# Patient Record
Sex: Male | Born: 2000 | Race: Black or African American | Hispanic: No | Marital: Single | Smoking: Never smoker
Health system: Southern US, Community
[De-identification: ages and names within clinical notes are randomized; demographics above are authoritative.]

---
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
## 2019-11-28 ENCOUNTER — Ambulatory Visit
Admission: EM | Admit: 2019-11-28 | Discharge: 2019-11-28 | Disposition: A | Discharge status: Home / Self Care | Payer: Medicaid Other | Attending: Physician Assistant | Admitting: Physician Assistant

## 2019-11-28 DIAGNOSIS — R0981 Nasal congestion: Secondary | ICD-10-CM

## 2019-11-28 DIAGNOSIS — H6983 Other specified disorders of Eustachian tube, bilateral: Secondary | ICD-10-CM

## 2019-11-28 DIAGNOSIS — J3489 Other specified disorders of nose and nasal sinuses: Secondary | ICD-10-CM

## 2019-11-28 MED ORDER — FLUTICASONE PROPIONATE 50 MCG/ACT NA SUSP: 2.0000 | Freq: Every day | NASAL | 0 refills | Status: DC

## 2019-11-28 MED ORDER — AZELASTINE HCL 0.1 % NA SOLN: 2.0000 | Freq: Two times a day (BID) | NASAL | 0 refills | Status: DC

## 2019-11-28 MED ORDER — CETIRIZINE HCL 10 MG PO TABS: 10.0000 mg | ORAL_TABLET | Freq: Every day | ORAL | 0 refills | Status: DC

## 2019-11-28 NOTE — Discharge Instructions (Signed)
COVID PCR testing ordered. I would like you to quarantine until testing results. Start zyrtec as directed. Flonase and azelastine as directed. Tylenol/motrin for pain and fever. Keep hydrated, urine should be clear to pale yellow in color. If experiencing shortness of breath, trouble breathing, go to the emergency department for further evaluation needed.

## 2019-11-28 NOTE — ED Provider Notes (Signed)
[facility]    CSN: [id number] Arrival date & time: 11/28/19  1300      History   Chief Complaint Chief Complaint  Patient presents with  . Nasal Congestion    HPI Robert Terry is a 19 y.o. male.   19 year old male comes in for 5 day of URI symptoms. Nasal congestion, sinus pressure. Minimal cough. Denies fever, chills, body aches. Denies abdominal pain, nausea, vomiting, diarrhea. Denies shortness of breath, loss of taste/smell. otc cold medicine, antihistamine     History reviewed. No pertinent past medical history.  There are no problems to display for this patient.   History reviewed. No pertinent surgical history.     Home Medications    Prior to Admission medications   Medication Sig Start Date End Date Taking? Authorizing Provider  azelastine (ASTELIN) 0.1 % nasal spray Place 2 sprays into both nostrils 2 (two) times daily. 11/28/19   Tasia Catchings, Kamilia Carollo V, PA-C  cetirizine (ZYRTEC) 10 MG tablet Take 1 tablet (10 mg total) by mouth daily. 11/28/19   Tasia Catchings, Gregoire Bennis V, PA-C  fluticasone (FLONASE) 50 MCG/ACT nasal spray Place 2 sprays into both nostrils daily. 11/28/19   Ok Edwards, PA-C    Family History History reviewed. No pertinent family history.  Social History Social History   Tobacco Use  . Smoking status: Never Smoker  . Smokeless tobacco: Never Used  Substance Use Topics  . Alcohol use: Not Currently  . Drug use: Not Currently     Allergies   Patient has no known allergies.   Review of Systems Review of Systems  Reason unable to perform ROS: See HPI as above.     Physical Exam Triage Vital Signs ED Triage Vitals  Enc Vitals Group     BP 11/28/19 1311 129/82     Pulse Rate 11/28/19 1311 92     Resp 11/28/19 1311 18     Temp 11/28/19 1311 98.3 F (36.8 C)     Temp Source 11/28/19 1311 Oral     SpO2 11/28/19 1311 98 %     Weight --      Height --      Head Circumference --      Peak Flow --      Pain Score 11/28/19 1319 0   Pain Loc --      Pain Edu? --      Excl. in [place]? --    No data found.  Updated Vital Signs BP 129/82 (BP Location: Left Arm)   Pulse 92   Temp 98.3 F (36.8 C) (Oral)   Resp 18   SpO2 98%   Physical Exam Constitutional:      General: He is not in acute distress.    Appearance: Normal appearance. He is not ill-appearing, toxic-appearing or diaphoretic.  HENT:     Head: Normocephalic and atraumatic.     Right Ear: Ear canal and external ear normal. No middle ear effusion. Tympanic membrane is erythematous. Tympanic membrane is not bulging.     Left Ear: Ear canal and external ear normal. A middle ear effusion is present. Tympanic membrane is erythematous. Tympanic membrane is not bulging.     Nose:     Right Sinus: Maxillary sinus tenderness and frontal sinus tenderness present.     Left Sinus: Maxillary sinus tenderness and frontal sinus tenderness present.     Mouth/Throat:     Mouth: Mucous membranes are moist.     Pharynx: Oropharynx is  clear. Uvula midline.  Cardiovascular:     Rate and Rhythm: Normal rate and regular rhythm.     Heart sounds: Normal heart sounds. No murmur. No friction rub. No gallop.   Pulmonary:     Effort: Pulmonary effort is normal. No accessory muscle usage, prolonged expiration, respiratory distress or retractions.     Comments: Lungs clear to auscultation without adventitious lung sounds. Musculoskeletal:     Cervical back: Normal range of motion and neck supple.  Neurological:     General: No focal deficit present.     Mental Status: He is alert and oriented to person, place, and time.      UC Treatments / Results  Labs (all labs ordered are listed, but only abnormal results are displayed) Labs Reviewed  NOVEL CORONAVIRUS, NAA    EKG   Radiology No results found.  Procedures Procedures (including critical care time)  Medications Ordered in UC Medications - No data to display  Initial Impression / Assessment and Plan / UC Course   I have reviewed the triage vital signs and the nursing notes.  Pertinent labs & imaging results that were available during my care of the patient were reviewed by me and considered in my medical decision making (see chart for details).    Bilateral erythematous TM without ear pain, likely due to to eustachian tube dysfunction, will defer antibiotics at this time.  Covid testing ordered, patient to quarantine until testing results return.  Will provide symptomatic management.  Push fluids.  Return precautions given.  Patient expresses understanding and agrees to plan.  Final Clinical Impressions(s) / UC Diagnoses   Final diagnoses:  Nasal congestion  Sinus pressure  Dysfunction of both eustachian tubes   ED Prescriptions    Medication Sig Dispense Auth. Provider   cetirizine (ZYRTEC) 10 MG tablet Take 1 tablet (10 mg total) by mouth daily. 30 tablet Reganne Messerschmidt V, PA-C   fluticasone (FLONASE) 50 MCG/ACT nasal spray Place 2 sprays into both nostrils daily. 1 g Emalie Mcwethy V, PA-C   azelastine (ASTELIN) 0.1 % nasal spray Place 2 sprays into both nostrils 2 (two) times daily. 30 mL Belinda Fisher, PA-C     PDMP not reviewed this encounter.   Belinda Fisher, PA-C 11/28/19 1348

## 2019-11-28 NOTE — ED Triage Notes (Signed)
Pt c/o nasal congestion and sinus pressure since Saturday.

## 2019-11-29 LAB — NOVEL CORONAVIRUS, NAA: SARS-CoV-2, NAA: DETECTED — AB

## 2019-11-29 LAB — SARS-COV-2, NAA 2 DAY TAT

## 2020-04-30 ENCOUNTER — Ambulatory Visit
Admission: EM | Admit: 2020-04-30 | Discharge: 2020-04-30 | Disposition: A | Discharge status: Home / Self Care | Payer: Medicaid Other | Attending: Emergency Medicine | Admitting: Emergency Medicine

## 2020-04-30 DIAGNOSIS — R519 Headache, unspecified: Secondary | ICD-10-CM

## 2020-04-30 DIAGNOSIS — Z1152 Encounter for screening for COVID-19: Secondary | ICD-10-CM | POA: Diagnosis not present

## 2020-04-30 MED ORDER — FLUTICASONE PROPIONATE 50 MCG/ACT NA SUSP: 1.0000 | Freq: Every day | NASAL | 0 refills | Status: DC

## 2020-04-30 MED ORDER — NAPROXEN 500 MG PO TABS: 500.0000 mg | ORAL_TABLET | Freq: Two times a day (BID) | ORAL | 0 refills | Status: DC

## 2020-04-30 NOTE — ED Triage Notes (Signed)
Pt present a headache, symptoms started two days ago. Pt has tried otc medication with no relief.

## 2020-04-30 NOTE — Discharge Instructions (Signed)
Naprosyn twice daily for headache Flonase nasal spray to help with nasal congestion/sinus pressure Rest and fluids Follow-up if not improving or worsening

## 2020-04-30 NOTE — ED Provider Notes (Signed)
[facility]    CSN: [id number] Arrival date & time: 04/30/20  7616      History   Chief Complaint Chief Complaint  Patient presents with  . Headache    HPI Robert Terry is a 19 y.o. male presenting today for evaluation of headache. Began yesterday. Took 800 mg ibuprofen. Feels similar to headache with prior covid infection. Had COVID end of April. Mild congestion. Denies sore throat cough. Headache is frontal, no vision changes. Denies GI changes. Often headache with not eating.   HPI  History reviewed. No pertinent past medical history.  There are no problems to display for this patient.   History reviewed. No pertinent surgical history.     Home Medications    Prior to Admission medications   Medication Sig Start Date End Date Taking? Authorizing Provider  azelastine (ASTELIN) 0.1 % nasal spray Place 2 sprays into both nostrils 2 (two) times daily. 11/28/19   Cathie Hoops, Amy V, PA-C  cetirizine (ZYRTEC) 10 MG tablet Take 1 tablet (10 mg total) by mouth daily. 11/28/19   Cathie Hoops, Amy V, PA-C  fluticasone (FLONASE) 50 MCG/ACT nasal spray Place 1-2 sprays into both nostrils daily. 04/30/20   Dawn Kiper C, PA-C  naproxen (NAPROSYN) 500 MG tablet Take 1 tablet (500 mg total) by mouth 2 (two) times daily. 04/30/20   Latori Beggs, Junius Creamer, PA-C    Family History History reviewed. No pertinent family history.  Social History Social History   Tobacco Use  . Smoking status: Never Smoker  . Smokeless tobacco: Never Used  Substance Use Topics  . Alcohol use: Not Currently  . Drug use: Not Currently     Allergies   Patient has no known allergies.   Review of Systems Review of Systems  Constitutional: Negative for fatigue and fever.  HENT: Negative for congestion, sinus pressure and sore throat.   Eyes: Negative for photophobia, pain and visual disturbance.  Respiratory: Negative for cough and shortness of breath.   Cardiovascular: Negative for chest pain.    Gastrointestinal: Negative for abdominal pain, nausea and vomiting.  Genitourinary: Negative for decreased urine volume and hematuria.  Musculoskeletal: Negative for myalgias, neck pain and neck stiffness.  Neurological: Positive for headaches. Negative for dizziness, syncope, facial asymmetry, speech difficulty, weakness, light-headedness and numbness.     Physical Exam Triage Vital Signs ED Triage Vitals  Enc Vitals Group     BP 04/30/20 0841 127/85     Pulse Rate 04/30/20 0841 72     Resp 04/30/20 0841 16     Temp 04/30/20 0841 98.2 F (36.8 C)     Temp Source 04/30/20 0841 Oral     SpO2 04/30/20 0841 97 %     Weight --      Height --      Head Circumference --      Peak Flow --      Pain Score 04/30/20 0842 6     Pain Loc --      Pain Edu? --      Excl. in [place]? --    No data found.  Updated Vital Signs BP 127/85 (BP Location: Left Arm)   Pulse 72   Temp 98.2 F (36.8 C) (Oral)   Resp 16   SpO2 97%   Visual Acuity Right Eye Distance:   Left Eye Distance:   Bilateral Distance:    Right Eye Near:   Left Eye Near:    Bilateral Near:     Physical  Exam Vitals and nursing note reviewed.  Constitutional:      Appearance: He is well-developed.     Comments: No acute distress  HENT:     Head: Normocephalic and atraumatic.     Nose: Nose normal.     Mouth/Throat:     Comments: Oral mucosa pink and moist, no tonsillar enlargement or exudate. Posterior pharynx patent and nonerythematous, no uvula deviation or swelling. Normal phonation. Eyes:     Extraocular Movements: Extraocular movements intact.     Conjunctiva/sclera: Conjunctivae normal.     Pupils: Pupils are equal, round, and reactive to light.  Cardiovascular:     Rate and Rhythm: Normal rate.  Pulmonary:     Effort: Pulmonary effort is normal. No respiratory distress.     Comments: Breathing comfortably at rest, CTABL, no wheezing, rales or other adventitious sounds auscultated Abdominal:      General: There is no distension.  Musculoskeletal:        General: Normal range of motion.     Cervical back: Neck supple.  Skin:    General: Skin is warm and dry.  Neurological:     Mental Status: He is alert and oriented to person, place, and time.      UC Treatments / Results  Labs (all labs ordered are listed, but only abnormal results are displayed) Labs Reviewed  NOVEL CORONAVIRUS, NAA    EKG   Radiology No results found.  Procedures Procedures (including critical care time)  Medications Ordered in UC Medications - No data to display  Initial Impression / Assessment and Plan / UC Course  I have reviewed the triage vital signs and the nursing notes.  Pertinent labs & imaging results that were available during my care of the patient were reviewed by me and considered in my medical decision making (see chart for details).     Covid PCR pending, 2 days of headache without neuro deficits or red flags. Recommending continued symptomatic and supportive care rest and fluids. Flonase for nasal congestion/sinus pressure contributing to headache. Naprosyn as alternative to ibuprofen for headache.  Discussed strict return precautions. Patient verbalized understanding and is agreeable with plan.  Final Clinical Impressions(s) / UC Diagnoses   Final diagnoses:  Encounter for screening for COVID-19  Acute nonintractable headache, unspecified headache type     Discharge Instructions     Naprosyn twice daily for headache Flonase nasal spray to help with nasal congestion/sinus pressure Rest and fluids Follow-up if not improving or worsening    ED Prescriptions    Medication Sig Dispense Auth. Provider   fluticasone (FLONASE) 50 MCG/ACT nasal spray Place 1-2 sprays into both nostrils daily. 16 g Artemisia Auvil C, PA-C   naproxen (NAPROSYN) 500 MG tablet Take 1 tablet (500 mg total) by mouth 2 (two) times daily. 30 tablet Ingris Pasquarella, [place] C, PA-C     PDMP not  reviewed this encounter.   Lew Dawes, [place] 04/30/20 [phone]

## 2020-05-02 LAB — NOVEL CORONAVIRUS, NAA: SARS-CoV-2, NAA: NOT DETECTED

## 2020-05-02 LAB — SARS-COV-2, NAA 2 DAY TAT

## 2020-08-28 ENCOUNTER — Other Ambulatory Visit: Payer: Self-pay

## 2020-08-28 ENCOUNTER — Ambulatory Visit
Admission: EM | Admit: 2020-08-28 | Discharge: 2020-08-28 | Disposition: A | Discharge status: Home / Self Care | Payer: Medicaid Other | Attending: Emergency Medicine | Admitting: Emergency Medicine

## 2020-08-28 ENCOUNTER — Ambulatory Visit: Payer: Self-pay

## 2020-08-28 DIAGNOSIS — R1030 Lower abdominal pain, unspecified: Secondary | ICD-10-CM | POA: Insufficient documentation

## 2020-08-28 DIAGNOSIS — Z7251 High risk heterosexual behavior: Secondary | ICD-10-CM

## 2020-08-28 DIAGNOSIS — Z202 Contact with and (suspected) exposure to infections with a predominantly sexual mode of transmission: Secondary | ICD-10-CM

## 2020-08-28 MED ORDER — CEFTRIAXONE SODIUM 500 MG IJ SOLR
500.0000 mg | Freq: Once | INTRAMUSCULAR | Status: AC
  Administered 2020-08-28: 500 mg via INTRAMUSCULAR

## 2020-08-28 MED ORDER — DOXYCYCLINE HYCLATE 100 MG PO CAPS: 100.0000 mg | ORAL_CAPSULE | Freq: Two times a day (BID) | ORAL | 0 refills | Status: AC

## 2020-08-28 NOTE — Discharge Instructions (Addendum)
Today you received treatment for chlamydia, gonorrhea. Testing for chlamydia, gonorrhea, trichomonas is pending: please look for these results on the MyChart app/website.  We will notify you if you are positive and outline treatment at that time.  Important to avoid all forms of sexual intercourse (oral, vaginal, anal) with any/all partners for the next 7 days to avoid spreading/reinfecting. Any/all sexual partners should be notified of testing/treatment today.  Return for persistent/worsening symptoms or if you develop fever, abdominal or pelvic pain, discharge, genital pain, blood in your urine, or are re-exposed to an STI. 

## 2020-08-28 NOTE — ED Triage Notes (Signed)
Pt states positive exposure to chlamydia. C/o lower abdominal pain at times over the past 2 wks.

## 2020-08-28 NOTE — ED Provider Notes (Signed)
[facility]    CSN: [id number] Arrival date & time: 08/28/20  1656      History   Chief Complaint Chief Complaint  Patient presents with  . appt 5- STD    HPI Robert Terry is a 20 y.o. male  Presenting for STI testing.  States that his current male partner tested positive for chlamydia the other week.  Endorsing lower abdominal pain without dysuria, urinary frequency, testicular pain, penile discharge x2 weeks.  No nausea, vomiting, fever.  History reviewed. No pertinent past medical history.  There are no problems to display for this patient.   History reviewed. No pertinent surgical history.     Home Medications    Prior to Admission medications   Medication Sig Start Date End Date Taking? Authorizing Provider  doxycycline (VIBRAMYCIN) 100 MG capsule Take 1 capsule (100 mg total) by mouth 2 (two) times daily for 7 days. 08/28/20 09/04/20 Yes Hall-Potvin, [place], PA-C    Family History History reviewed. No pertinent family history.  Social History Social History   Tobacco Use  . Smoking status: Never Smoker  . Smokeless tobacco: Never Used  Substance Use Topics  . Alcohol use: Not Currently  . Drug use: Not Currently     Allergies   Patient has no known allergies.   Review of Systems Review of Systems  Constitutional: Negative for fatigue and fever.  Gastrointestinal: Positive for abdominal pain. Negative for constipation, diarrhea, nausea and vomiting.  Genitourinary: Negative for dysuria, frequency, genital sores, penile discharge, penile pain, penile swelling, scrotal swelling, testicular pain and urgency.  Musculoskeletal: Negative for arthralgias and myalgias.  Skin: Negative for color change and rash.     Physical Exam Triage Vital Signs ED Triage Vitals  Enc Vitals Group     BP      Pulse      Resp      Temp      Temp src      SpO2      Weight      Height      Head Circumference      Peak Flow      Pain  Score      Pain Loc      Pain Edu?      Excl. in [place]?    No data found.  Updated Vital Signs BP 135/84   Pulse 80   Temp 98.1 F (36.7 C) (Oral)   Resp 16   SpO2 97%   Visual Acuity Right Eye Distance:   Left Eye Distance:   Bilateral Distance:    Right Eye Near:   Left Eye Near:    Bilateral Near:     Physical Exam Constitutional:      General: He is not in acute distress. HENT:     Head: Normocephalic and atraumatic.  Eyes:     General: No scleral icterus.    Pupils: Pupils are equal, round, and reactive to light.  Cardiovascular:     Rate and Rhythm: Normal rate.  Pulmonary:     Effort: Pulmonary effort is normal. No respiratory distress.     Breath sounds: No wheezing.  Skin:    Coloration: Skin is not jaundiced or pale.  Neurological:     Mental Status: He is alert and oriented to person, place, and time.      UC Treatments / Results  Labs (all labs ordered are listed, but only abnormal results are displayed) Labs Reviewed  CYTOLOGY, (ORAL, ANAL,  URETHRAL) ANCILLARY ONLY    EKG   Radiology No results found.  Procedures Procedures (including critical care time)  Medications Ordered in UC Medications  cefTRIAXone (ROCEPHIN) injection 500 mg (500 mg Intramuscular Given 08/28/20 1758)    Initial Impression / Assessment and Plan / UC Course  I have reviewed the triage vital signs and the nursing notes.  Pertinent labs & imaging results that were available during my care of the patient were reviewed by me and considered in my medical decision making (see chart for details).     Cytology pending, will treat for G/C in the interim given sexually active with multiple partners.  Return precautions discussed, pt verbalized understanding and is agreeable to plan. Final Clinical Impressions(s) / UC Diagnoses   Final diagnoses:  Unprotected sex  Exposure to chlamydia  Lower abdominal pain     Discharge Instructions     Today you received  treatment for chlamydia, gonorrhea. Testing for chlamydia, gonorrhea, trichomonas is pending: please look for these results on the MyChart app/website.  We will notify you if you are positive and outline treatment at that time.  Important to avoid all forms of sexual intercourse (oral, vaginal, anal) with any/all partners for the next 7 days to avoid spreading/reinfecting. Any/all sexual partners should be notified of testing/treatment today.  Return for persistent/worsening symptoms or if you develop fever, abdominal or pelvic pain, discharge, genital pain, blood in your urine, or are re-exposed to an STI.    ED Prescriptions    Medication Sig Dispense Auth. Provider   doxycycline (VIBRAMYCIN) 100 MG capsule Take 1 capsule (100 mg total) by mouth 2 (two) times daily for 7 days. 14 capsule Hall-Potvin, [place], PA-C     PDMP not reviewed this encounter.   Odette Fraction [place], [place] 08/28/20 1943

## 2020-08-29 LAB — CYTOLOGY, (ORAL, ANAL, URETHRAL) ANCILLARY ONLY
Chlamydia: POSITIVE — AB
Comment: NEGATIVE
Comment: NEGATIVE
Comment: NORMAL
Neisseria Gonorrhea: NEGATIVE
Trichomonas: NEGATIVE

## 2020-11-23 ENCOUNTER — Ambulatory Visit
Admission: EM | Admit: 2020-11-23 | Discharge: 2020-11-23 | Disposition: A | Discharge status: Home / Self Care | Payer: Medicaid Other | Attending: Emergency Medicine | Admitting: Emergency Medicine

## 2020-11-23 ENCOUNTER — Encounter: Payer: Self-pay | Admitting: *Deleted

## 2020-11-23 ENCOUNTER — Other Ambulatory Visit: Payer: Self-pay

## 2020-11-23 DIAGNOSIS — Z113 Encounter for screening for infections with a predominantly sexual mode of transmission: Secondary | ICD-10-CM

## 2020-11-23 NOTE — Discharge Instructions (Signed)
We are testing you for Gonorrhea, Chlamydia and Trichomonas. We will call you if anything is positive and let you know if you require any further treatment. Please inform partner of any positive results.  Please return if symptoms not improving with treatment, development of fever, nausea, vomiting, abdominal pain, scrotal pain. 

## 2020-11-23 NOTE — ED Triage Notes (Signed)
Pt presents today for recheck for STD. Pt reports he does not have any Sx's

## 2020-11-23 NOTE — ED Provider Notes (Signed)
[facility]    CSN: [id number] Arrival date & time: 11/23/20  1216      History   Chief Complaint Chief Complaint  Patient presents with  . Exposure to STD    HPI ABDO DENAULT is a 20 y.o. male presenting today for STD screening.  Denies any symptoms.  Reports chlamydia infection in January and wanting to retest for ensure resolution.  Denies any symptoms.  Denies any new exposures.  HPI  History reviewed. No pertinent past medical history.  There are no problems to display for this patient.   History reviewed. No pertinent surgical history.     Home Medications    Prior to Admission medications   Not on File    Family History History reviewed. No pertinent family history.  Social History Social History   Tobacco Use  . Smoking status: Never Smoker  . Smokeless tobacco: Never Used  Substance Use Topics  . Alcohol use: Not Currently  . Drug use: Not Currently     Allergies   Patient has no known allergies.   Review of Systems Review of Systems  Constitutional: Negative for fever.  HENT: Negative for sore throat.   Respiratory: Negative for shortness of breath.   Cardiovascular: Negative for chest pain.  Gastrointestinal: Negative for abdominal pain, nausea and vomiting.  Genitourinary: Negative for difficulty urinating, dysuria, frequency, penile discharge, penile pain, penile swelling, scrotal swelling and testicular pain.  Skin: Negative for rash.  Neurological: Negative for dizziness, light-headedness and headaches.     Physical Exam Triage Vital Signs ED Triage Vitals [11/23/20 1240]  Enc Vitals Group     BP 123/79     Pulse Rate 77     Resp 16     Temp 98.6 F (37 C)     Temp Source Oral     SpO2 97 %     Weight      Height      Head Circumference      Peak Flow      Pain Score 0     Pain Loc      Pain Edu?      Excl. in [place]?    No data found.  Updated Vital Signs BP 123/79 (BP Location: Left Arm)    Pulse 77   Temp 98.6 F (37 C) (Oral)   Resp 16   SpO2 97%   Visual Acuity Right Eye Distance:   Left Eye Distance:   Bilateral Distance:    Right Eye Near:   Left Eye Near:    Bilateral Near:     Physical Exam Vitals and nursing note reviewed.  Constitutional:      Appearance: He is well-developed.     Comments: No acute distress  HENT:     Head: Normocephalic and atraumatic.     Nose: Nose normal.  Eyes:     Conjunctiva/sclera: Conjunctivae normal.  Cardiovascular:     Rate and Rhythm: Normal rate.  Pulmonary:     Effort: Pulmonary effort is normal. No respiratory distress.  Abdominal:     General: There is no distension.  Musculoskeletal:        General: Normal range of motion.     Cervical back: Neck supple.  Skin:    General: Skin is warm and dry.  Neurological:     Mental Status: He is alert and oriented to person, place, and time.      UC Treatments / Results  Labs (all labs ordered  are listed, but only abnormal results are displayed) Labs Reviewed  CYTOLOGY, (ORAL, ANAL, URETHRAL) ANCILLARY ONLY    EKG   Radiology No results found.  Procedures Procedures (including critical care time)  Medications Ordered in UC Medications - No data to display  Initial Impression / Assessment and Plan / UC Course  I have reviewed the triage vital signs and the nursing notes.  Pertinent labs & imaging results that were available during my care of the patient were reviewed by me and considered in my medical decision making (see chart for details).     Urethral cytology pending, currently asymptomatic.  Will call with results and provide treatment if needed.  Discussed strict return precautions. Patient verbalized understanding and is agreeable with plan.  Final Clinical Impressions(s) / UC Diagnoses   Final diagnoses:  Screen for STD (sexually transmitted disease)     Discharge Instructions     We are testing you for Gonorrhea, Chlamydia and  Trichomonas. We will call you if anything is positive and let you know if you require any further treatment. Please inform partner of any positive results.  Please return if symptoms not improving with treatment, development of fever, nausea, vomiting, abdominal pain, scrotal pain.    ED Prescriptions    None     PDMP not reviewed this encounter.   Gabryela Kimbrell, [place] C, PA-C 11/23/20 1330

## 2020-11-24 LAB — CYTOLOGY, (ORAL, ANAL, URETHRAL) ANCILLARY ONLY
Chlamydia: NEGATIVE
Comment: NEGATIVE
Comment: NEGATIVE
Comment: NORMAL
Neisseria Gonorrhea: NEGATIVE
Trichomonas: NEGATIVE

## 2020-12-23 ENCOUNTER — Ambulatory Visit: Payer: Medicaid Other | Attending: Internal Medicine | Admitting: Internal Medicine

## 2020-12-23 ENCOUNTER — Other Ambulatory Visit: Payer: Self-pay

## 2021-04-10 ENCOUNTER — Ambulatory Visit: Admit: 2021-04-10 | Disposition: A | Discharge status: Home / Self Care | Payer: Medicaid Other

## 2021-04-11 ENCOUNTER — Other Ambulatory Visit: Payer: Self-pay

## 2021-04-11 ENCOUNTER — Ambulatory Visit
Admission: EM | Admit: 2021-04-11 | Discharge: 2021-04-11 | Disposition: A | Discharge status: Home / Self Care | Payer: Medicaid Other | Attending: Urgent Care | Admitting: Urgent Care

## 2021-04-11 VITALS — BP 125/84 | HR 88 | Temp 97.7°F | Resp 18

## 2021-04-11 DIAGNOSIS — Z7251 High risk heterosexual behavior: Secondary | ICD-10-CM | POA: Diagnosis present

## 2021-04-11 DIAGNOSIS — Z113 Encounter for screening for infections with a predominantly sexual mode of transmission: Secondary | ICD-10-CM | POA: Insufficient documentation

## 2021-04-11 NOTE — ED Triage Notes (Signed)
Pt presents today for STD testing. Denies dysuria, hematuria, discharge, urinary urgency and frequency. Pt c/o 6 day hx of penis having "weird" intermittent feelings. No penis pain or lesions. Denies any known STD exposures.

## 2021-04-11 NOTE — ED Provider Notes (Signed)
  [facility]   MRN: [id number] DOB: 16-Nov-2000  Subjective:   LENNELL SHANKS is a 20 y.o. male presenting for testing for sexually transmitted infections.  Patient reports that the past week he has had weird sensation of the penis. Denies dysuria, hematuria, urinary frequency, penile discharge, penile swelling, testicular pain, testicular swelling, anal pain, groin pain.  He is sexually active, does not use condoms for protection, has 1 male partner.  Does not care for testing of the oral or anal area.  Does not want HIV or syphilis testing.  No current facility-administered medications for this encounter. No current outpatient medications on file.   No Known Allergies  History reviewed. No pertinent past medical history.   History reviewed. No pertinent surgical history.  History reviewed. No pertinent family history.  Social History   Tobacco Use   Smoking status: Never   Smokeless tobacco: Never  Substance Use Topics   Alcohol use: Not Currently   Drug use: Not Currently    ROS   Objective:   Vitals: BP 125/84 (BP Location: Right Arm)   Pulse 88   Temp 97.7 F (36.5 C) (Oral)   Resp 18   SpO2 96%   Physical Exam Constitutional:      General: He is not in acute distress.    Appearance: Normal appearance. He is well-developed and normal weight. He is not ill-appearing, toxic-appearing or diaphoretic.  HENT:     Head: Normocephalic and atraumatic.     Right Ear: External ear normal.     Left Ear: External ear normal.     Nose: Nose normal.     Mouth/Throat:     Pharynx: Oropharynx is clear.  Eyes:     General: No scleral icterus.       Right eye: No discharge.        Left eye: No discharge.     Extraocular Movements: Extraocular movements intact.     Pupils: Pupils are equal, round, and reactive to light.  Cardiovascular:     Rate and Rhythm: Normal rate.  Pulmonary:     Effort: Pulmonary effort is normal.  Musculoskeletal:      Cervical back: Normal range of motion.  Neurological:     Mental Status: He is alert and oriented to person, place, and time.  Psychiatric:        Mood and Affect: Mood normal.        Behavior: Behavior normal.        Thought Content: Thought content normal.        Judgment: Judgment normal.      Assessment and Plan :   PDMP not reviewed this encounter.  1. Unprotected sex   2. Routine screening for STI (sexually transmitted infection)     Will treat as appropriate for sexually transmitted infection based off of his results.  Patient refused HIV and syphilis testing.   Wallis Bamberg, [place] 04/11/21 [phone]

## 2021-04-14 LAB — CYTOLOGY, (ORAL, ANAL, URETHRAL) ANCILLARY ONLY
Chlamydia: NEGATIVE
Comment: NEGATIVE
Comment: NEGATIVE
Comment: NORMAL
Neisseria Gonorrhea: NEGATIVE
Trichomonas: NEGATIVE

## 2022-04-24 ENCOUNTER — Ambulatory Visit: Payer: Self-pay

## 2022-04-26 ENCOUNTER — Ambulatory Visit
Admission: RE | Admit: 2022-04-26 | Discharge: 2022-04-26 | Disposition: A | Discharge status: Home / Self Care | Payer: Medicaid Other | Source: Ambulatory Visit | Attending: Internal Medicine | Admitting: Internal Medicine

## 2022-04-26 VITALS — BP 109/70 | HR 72 | Temp 98.6°F | Resp 16

## 2022-04-26 DIAGNOSIS — B360 Pityriasis versicolor: Secondary | ICD-10-CM | POA: Diagnosis not present

## 2022-04-26 MED ORDER — KETOCONAZOLE 2 % EX CREA: 1.0000 | TOPICAL_CREAM | Freq: Every day | CUTANEOUS | 0 refills | Status: AC

## 2022-04-26 MED ORDER — FLUCONAZOLE 150 MG PO TABS: 150.0000 mg | ORAL_TABLET | ORAL | 0 refills | Status: AC

## 2022-04-26 NOTE — ED Triage Notes (Addendum)
Pt is present today with a flare up from eczema. Pt states that he noticed it on his face, neck,and shoulders. Pt states that he has been dealing with it for a while and usually uses a cream that is prescribed but ran out.

## 2022-04-26 NOTE — ED Provider Notes (Signed)
[facility]    CSN: [id number] Arrival date & time: 04/26/22  1153      History   Chief Complaint Chief Complaint  Patient presents with   Eczema    HPI Robert Terry is a 21 y.o. male.   Patient presents with itchy rash to bottom of face, neck, and chest that has been present for a few days.  Denies changes to lotions, soaps, detergents, foods, etc.  Patient is stating that he has a history of "eczema" and is usually treated with Diflucan and a "cream".  He states that he ran out of this medication but has not been able to see dermatology to get a refill.  Denies any associated fever.     History reviewed. No pertinent past medical history.  There are no problems to display for this patient.   History reviewed. No pertinent surgical history.     Home Medications    Prior to Admission medications   Medication Sig Start Date End Date Taking? Authorizing Provider  fluconazole (DIFLUCAN) 150 MG tablet Take 1 tablet (150 mg total) by mouth once a week. 04/26/22  Yes Mickey Esguerra, Hildred Alamin E, FNP  ketoconazole (NIZORAL) 2 % cream Apply 1 Application topically daily. Do not apply to face 04/26/22  Yes Teodora Medici, FNP    Family History History reviewed. No pertinent family history.  Social History Social History   Tobacco Use   Smoking status: Never   Smokeless tobacco: Never  Substance Use Topics   Alcohol use: Not Currently   Drug use: Not Currently     Allergies   Patient has no known allergies.   Review of Systems Review of Systems Per HPI  Physical Exam Triage Vital Signs ED Triage Vitals  Enc Vitals Group     BP 04/26/22 1237 109/70     Pulse Rate 04/26/22 1237 72     Resp 04/26/22 1237 16     Temp 04/26/22 1237 98.6 F (37 C)     Temp src --      SpO2 04/26/22 1237 97 %     Weight --      Height --      Head Circumference --      Peak Flow --      Pain Score 04/26/22 1236 0     Pain Loc --      Pain Edu? --      Excl. in [place]?  --    No data found.  Updated Vital Signs BP 109/70   Pulse 72   Temp 98.6 F (37 C)   Resp 16   SpO2 97%   Visual Acuity Right Eye Distance:   Left Eye Distance:   Bilateral Distance:    Right Eye Near:   Left Eye Near:    Bilateral Near:     Physical Exam Constitutional:      General: He is not in acute distress.    Appearance: Normal appearance. He is not toxic-appearing or diaphoretic.  HENT:     Head: Normocephalic and atraumatic.  Eyes:     Extraocular Movements: Extraocular movements intact.     Conjunctiva/sclera: Conjunctivae normal.  Pulmonary:     Effort: Pulmonary effort is normal.  Skin:    Comments: Patches of scaly lower pigmented areas across upper chest, neck, chin.  Neurological:     General: No focal deficit present.     Mental Status: He is alert and oriented to person, place, and time. Mental  status is at baseline.  Psychiatric:        Mood and Affect: Mood normal.        Behavior: Behavior normal.        Thought Content: Thought content normal.        Judgment: Judgment normal.      UC Treatments / Results  Labs (all labs ordered are listed, but only abnormal results are displayed) Labs Reviewed - No data to display  EKG   Radiology No results found.  Procedures Procedures (including critical care time)  Medications Ordered in UC Medications - No data to display  Initial Impression / Assessment and Plan / UC Course  I have reviewed the triage vital signs and the nursing notes.  Pertinent labs & imaging results that were available during my care of the patient were reviewed by me and considered in my medical decision making (see chart for details).     Rash is consistent with tinea versicolor.  After further review of patient's chart, it appears the patient has a history of this.  He is typically treated with Diflucan and ketoconazole cream.  Will use same treatment regimen.  Advised patient to not use cream on face.  Patient  voiced understanding.  Advised patient to follow-up if symptoms persist or worsen.  Patient verbalized understanding and was agreeable with plan. Final Clinical Impressions(s) / UC Diagnoses   Final diagnoses:  Tinea versicolor     Discharge Instructions      It appears that you have tinea versicolor which is a fungal infection of the skin.  It appears that you have had this in the past.  I have prescribed you the same medications that you have used previously.  Please do not apply cream to face.  Follow-up if symptoms persist or worsen.    ED Prescriptions     Medication Sig Dispense Auth. Provider   fluconazole (DIFLUCAN) 150 MG tablet Take 1 tablet (150 mg total) by mouth once a week. 3 tablet [place], [employer] [place] E, [place]   ketoconazole (NIZORAL) 2 % cream Apply 1 Application topically daily. Do not apply to face 15 g Gustavus Bryant, [place]      PDMP not reviewed this encounter.   Gustavus Bryant, [place] 04/26/22 1321

## 2022-04-26 NOTE — Discharge Instructions (Signed)
It appears that you have tinea versicolor which is a fungal infection of the skin.  It appears that you have had this in the past.  I have prescribed you the same medications that you have used previously.  Please do not apply cream to face.  Follow-up if symptoms persist or worsen.

## 2022-06-24 ENCOUNTER — Telehealth: Payer: Self-pay | Admitting: Emergency Medicine

## 2022-06-24 NOTE — Telephone Encounter (Signed)
Contacted patient and confirmed that he will be at new patient appt on 11/28

## 2022-07-06 ENCOUNTER — Ambulatory Visit: Payer: Medicaid Other | Admitting: Nurse Practitioner
